# Patient Record
Sex: Male | Born: 1970 | Race: White | Hispanic: No | Marital: Married | Smoking: Never smoker
Health system: Southern US, Community
[De-identification: ages and names within clinical notes are randomized; demographics above are authoritative.]

## PROBLEM LIST (undated history)

## (undated) DIAGNOSIS — I10 Essential (primary) hypertension: Secondary | ICD-10-CM

## (undated) DIAGNOSIS — M109 Gout, unspecified: Secondary | ICD-10-CM

## (undated) HISTORY — PX: APPENDECTOMY: SHX54

---

## 2016-11-20 ENCOUNTER — Encounter: Payer: Self-pay | Admitting: *Deleted

## 2016-11-20 ENCOUNTER — Emergency Department
Admission: EM | Admit: 2016-11-20 | Discharge: 2016-11-20 | Disposition: A | Discharge status: Home / Self Care | Payer: 59 | Source: Home / Self Care | Attending: Family Medicine | Admitting: Family Medicine

## 2016-11-20 DIAGNOSIS — R42 Dizziness and giddiness: Secondary | ICD-10-CM

## 2016-11-20 DIAGNOSIS — H6593 Unspecified nonsuppurative otitis media, bilateral: Secondary | ICD-10-CM

## 2016-11-20 HISTORY — DX: Essential (primary) hypertension: I10

## 2016-11-20 LAB — POCT FASTING CBG KUC MANUAL ENTRY: POCT Glucose (KUC): 163 mg/dL — AB (ref 70–99)

## 2016-11-20 MED ORDER — LORATADINE 10 MG PO TABS: 10.0000 mg | ORAL_TABLET | Freq: Every day | ORAL | 1 refills | Status: AC

## 2016-11-20 MED ORDER — PREDNISONE 20 MG PO TABS: ORAL_TABLET | ORAL | 0 refills | Status: AC

## 2016-11-20 MED ORDER — IPRATROPIUM BROMIDE 0.06 % NA SOLN: 2.0000 | Freq: Four times a day (QID) | NASAL | 1 refills | Status: AC

## 2016-11-20 NOTE — ED Provider Notes (Signed)
[facility]    CSN: [id number] Arrival date & time: 11/20/16  1032   History   Chief Complaint Chief Complaint  Patient presents with  . Dizziness    HPI Blake Mckinney is a 46 y.o. male.   HPI  Blake Mckinney is a 46 y.o. male presenting to UC with c/o mild frontal sinus pressure this morning, denies pain/headache.  [place] [facility] [place] felt a mild dizziness/lightheadedness and warmth that "washed over me."  He ate a biscuit this morning then ate a cookie after the spell but only felt moderately better.  He still has intermittent sensation of dizziness with certain movements.  He notes he did take BC powder this morning for back pain, which he does not usually take in the morning. Otherwise, no recent changes.  He does have mild congestion and reports environmental allergies. He has been off his Claritin for several days. He has been on Flonase and prednisone in the past for environmental rhinitis.  Denies fever, chills, n/v/d. Denies weakness or numbness in arms or legs.    Past Medical History:  Diagnosis Date  . Hypertension     There are no active problems to display for this patient.   Past Surgical History:  Procedure Laterality Date  . APPENDECTOMY         Home Medications    Prior to Admission medications   Medication Sig Start Date End Date Taking? Authorizing Provider  ALLOPURINOL PO Take by mouth.   Yes [provider]  AMLODIPINE BESYLATE PO Take by mouth.   Yes [provider]  hydrochlorothiazide (HYDRODIURIL) 25 MG tablet Take 25 mg by mouth daily.   Yes [provider]  ipratropium (ATROVENT) 0.06 % nasal spray Place 2 sprays into both nostrils 4 (four) times daily. 11/20/16   Lurene ShadowPhelps, Marcio Hoque O, PA-C  loratadine (CLARITIN) 10 MG tablet Take 1 tablet (10 mg total) by mouth daily. One po daily x 5 days 11/20/16   Lurene ShadowPhelps, Eiliyah Reh O, PA-C  predniSONE (DELTASONE) 20 MG tablet 3 tabs po day one, then 2 po daily x  4 days 11/20/16   Lurene ShadowPhelps, Vianny Schraeder O, PA-C    Family History Family History  Problem Relation Age of Onset  . Cancer Mother        Breast CA  . COPD Father     Social History Social History  Substance Use Topics  . Smoking status: Never Smoker  . Smokeless tobacco: Never Used  . Alcohol use No     Allergies   Erythromycin   Review of Systems Review of Systems  Constitutional: Negative for chills and fever.  HENT: Positive for congestion ( mild) and sinus pressure. Negative for ear pain, rhinorrhea, sore throat, trouble swallowing and voice change.   Respiratory: Negative for cough and shortness of breath.   Cardiovascular: Negative for chest pain and palpitations.  Gastrointestinal: Negative for abdominal pain, diarrhea, nausea and vomiting.  Musculoskeletal: Negative for arthralgias, back pain and myalgias.  Skin: Negative for rash.  Neurological: Positive for dizziness and light-headedness. Negative for syncope, facial asymmetry, speech difficulty, weakness, numbness and headaches.     Physical Exam Triage Vital Signs ED Triage Vitals [11/20/16 1100]  Enc Vitals Group     BP (!) 147/91     Pulse Rate 91     Resp      Temp 98.2 F (36.8 C)     Temp Source Oral     SpO2 100 %  Weight 234 lb (106.1 kg)     Height  (1.88 m)     Head Circumference      Peak Flow      Pain Score 0     Pain Loc      Pain Edu?      Excl. in GC?    Orthostatic VS for the past 24 hrs:  BP- Lying Pulse- Lying BP- Sitting Pulse- Sitting BP- Standing at 0 minutes Pulse- Standing at 0 minutes  11/20/16 1105 (!) 147/91 86 (!) 141/94 98 128/87 95    Updated Vital Signs BP (!) 147/91 (BP Location: Left Arm)   Pulse 91   Temp 98.2 F (36.8 C) (Oral)   Ht  (1.88 m)   Wt 234 lb (106.1 kg)   SpO2 100%   BMI 30.04 kg/m   Visual Acuity Right Eye Distance:   Left Eye Distance:   Bilateral Distance:    Right Eye Near:   Left Eye Near:    Bilateral Near:     Physical  Exam  Constitutional: He is oriented to person, place, and time. He appears well-developed and well-nourished. No distress.  HENT:  Head: Normocephalic and atraumatic.  Right Ear: A middle ear effusion is present.  Left Ear: Tympanic membrane normal.  Nose: Nose normal. Right sinus exhibits no maxillary sinus tenderness and no frontal sinus tenderness. Left sinus exhibits no maxillary sinus tenderness and no frontal sinus tenderness.  Mouth/Throat: Uvula is midline, oropharynx is clear and moist and mucous membranes are normal.  Eyes: Pupils are equal, round, and reactive to light. Conjunctivae and EOM are normal. Right eye exhibits no nystagmus. Left eye exhibits no nystagmus.  Neck: Normal range of motion. Neck supple.  Cardiovascular: Normal rate and regular rhythm.   Pulmonary/Chest: Effort normal and breath sounds normal. No stridor. No respiratory distress. He has no wheezes. He has no rales.  Musculoskeletal: Normal range of motion.  Lymphadenopathy:    He has no cervical adenopathy.  Neurological: He is alert and oriented to person, place, and time. No cranial nerve deficit.  CN II-XII in tact. Speech is clear. Alert to person, place and time. Normal finger to nose coordination. Normal gait.  Skin: Skin is warm and dry. He is not diaphoretic.  Psychiatric: He has a normal mood and affect. His behavior is normal.  Nursing note and vitals reviewed.    UC Treatments / Results  Labs (all labs ordered are listed, but only abnormal results are displayed) Labs Reviewed  POCT FASTING CBG [facility] MANUAL ENTRY - Abnormal; Notable for the following:       Result Value   POCT Glucose ([facility]) 163 (*)    All other components within normal limits    EKG  EKG Interpretation None       Radiology No results found.  Procedures Procedures (including critical care time)  Medications Ordered in UC Medications - No data to display   Initial Impression / Assessment and Plan / UC Course    I have reviewed the triage vital signs and the nursing notes.  Pertinent labs & imaging results that were available during my care of the patient were reviewed by me and considered in my medical decision making (see chart for details).     Pt c/o intermittent lightheadedness that started this morning with mild sinus pressure. Normal neuro exam.   Tympanometry: Right ear- questionable High Peak Height, Left ear- Positive tympanometric peak pressure.  Will treat with prednisone, Claritin,  and ipratropium nasal spray.  Final Clinical Impressions(s) / UC Diagnoses   Final diagnoses:  Lightheadedness  Middle ear effusion, bilateral   Doubt CVA, SAH, meningitis, or other emergent process taking place at this time.  Encouraged rest and good hydration F/u with PCP early next week if not improving. Discussed symptoms that warrant emergent care in the ED.   New Prescriptions Discharge Medication List as of 11/20/2016 11:26 AM    START taking these medications   Details  ipratropium (ATROVENT) 0.06 % nasal spray Place 2 sprays into both nostrils 4 (four) times daily., Starting Fri 11/20/2016, Normal    loratadine (CLARITIN) 10 MG tablet Take 1 tablet (10 mg total) by mouth daily. One po daily x 5 days, Starting Fri 11/20/2016, Normal    predniSONE (DELTASONE) 20 MG tablet 3 tabs po day one, then 2 po daily x 4 days, Normal         Controlled Substance Prescriptions [place] Controlled Substance Registry consulted? Not Applicable   Rolla Plate 11/20/16 1202

## 2016-11-20 NOTE — ED Triage Notes (Signed)
Pt reports a mild frontal HA this AM that has resolved. Later this AM c/o sudden feeling of dizziness that "washed over me". He ate a biscuit this AM and a cookie after the spell he reports he took a BC powder this AM and does not usually take them.

## 2016-11-22 ENCOUNTER — Telehealth: Payer: Self-pay | Admitting: Emergency Medicine

## 2016-11-22 NOTE — Telephone Encounter (Signed)
CB to patient, he states he is not quite as dizzy. [facility].  AP, CMA

## 2017-02-23 ENCOUNTER — Emergency Department
Admission: EM | Admit: 2017-02-23 | Discharge: 2017-02-23 | Disposition: A | Discharge status: Home / Self Care | Payer: 59 | Attending: Emergency Medicine | Admitting: Emergency Medicine

## 2017-02-23 ENCOUNTER — Encounter: Payer: Self-pay | Admitting: Emergency Medicine

## 2017-02-23 ENCOUNTER — Emergency Department: Payer: 59

## 2017-02-23 ENCOUNTER — Other Ambulatory Visit: Payer: Self-pay

## 2017-02-23 DIAGNOSIS — K219 Gastro-esophageal reflux disease without esophagitis: Secondary | ICD-10-CM | POA: Diagnosis not present

## 2017-02-23 DIAGNOSIS — R0789 Other chest pain: Secondary | ICD-10-CM | POA: Diagnosis not present

## 2017-02-23 DIAGNOSIS — E876 Hypokalemia: Secondary | ICD-10-CM | POA: Insufficient documentation

## 2017-02-23 DIAGNOSIS — R079 Chest pain, unspecified: Secondary | ICD-10-CM | POA: Diagnosis present

## 2017-02-23 DIAGNOSIS — I10 Essential (primary) hypertension: Secondary | ICD-10-CM | POA: Diagnosis not present

## 2017-02-23 DIAGNOSIS — F17228 Nicotine dependence, chewing tobacco, with other nicotine-induced disorders: Secondary | ICD-10-CM | POA: Diagnosis not present

## 2017-02-23 DIAGNOSIS — Z79899 Other long term (current) drug therapy: Secondary | ICD-10-CM | POA: Insufficient documentation

## 2017-02-23 HISTORY — DX: Gout, unspecified: M10.9

## 2017-02-23 LAB — BASIC METABOLIC PANEL
ANION GAP: 13 (ref 5–15)
BUN: 12 mg/dL (ref 6–20)
CALCIUM: 9.6 mg/dL (ref 8.9–10.3)
CO2: 29 mmol/L (ref 22–32)
Chloride: 101 mmol/L (ref 101–111)
Creatinine, Ser: 0.99 mg/dL (ref 0.61–1.24)
GFR calc Af Amer: >60 mL/min (ref >60)
GLUCOSE: 115 mg/dL — AB (ref 65–99)
Potassium: 3.1 mmol/L — ABNORMAL LOW (ref 3.5–5.1)
Sodium: 143 mmol/L (ref 135–145)

## 2017-02-23 LAB — CBC
HEMATOCRIT: 49.1 % (ref 40.0–52.0)
Hemoglobin: 17.2 g/dL (ref 13.0–18.0)
MCH: 32.1 pg (ref 26.0–34.0)
MCHC: 35.1 g/dL (ref 32.0–36.0)
MCV: 91.5 fL (ref 80.0–100.0)
PLATELETS: 255 10*3/uL (ref 150–440)
RBC: 5.37 MIL/uL (ref 4.40–5.90)
RDW: 13.3 % (ref 11.5–14.5)
WBC: 8.1 10*3/uL (ref 3.8–10.6)

## 2017-02-23 LAB — TROPONIN I

## 2017-02-23 MED ORDER — ASPIRIN 81 MG PO CHEW
324.0000 mg | CHEWABLE_TABLET | Freq: Once | ORAL | Status: AC
  Administered 2017-02-23: 324 mg via ORAL
  Filled 2017-02-23: qty 4

## 2017-02-23 MED ORDER — POTASSIUM CHLORIDE CRYS ER 20 MEQ PO TBCR
40.0000 meq | EXTENDED_RELEASE_TABLET | Freq: Once | ORAL | Status: AC
  Administered 2017-02-23: 40 meq via ORAL
  Filled 2017-02-23: qty 2

## 2017-02-23 MED ORDER — POTASSIUM CHLORIDE CRYS ER 20 MEQ PO TBCR: 20.0000 meq | EXTENDED_RELEASE_TABLET | Freq: Every day | ORAL | 0 refills | Status: AC

## 2017-02-23 MED ORDER — OMEPRAZOLE MAGNESIUM 20 MG PO TBEC: 20.0000 mg | DELAYED_RELEASE_TABLET | Freq: Every day | ORAL | 1 refills | Status: AC

## 2017-02-23 MED ORDER — GI COCKTAIL ~~LOC~~
30.0000 mL | Freq: Once | ORAL | Status: AC
  Administered 2017-02-23: 30 mL via ORAL
  Filled 2017-02-23: qty 30

## 2017-02-23 NOTE — ED Notes (Signed)
Patient presents to the ED with intermittent chest pain since Saturday.  Patient also reports a lack of appetite and states, "I feel like I can't burp."  Patient denies shortness of breath.  Patient is in no obvious distress at this time.

## 2017-02-23 NOTE — ED Triage Notes (Signed)
Pt to ed with c/o pain on Saturday, Sunday and Monday intermittently.  Pt states "it also feels like I have heartburn"  Denies sob, weakness.

## 2017-02-23 NOTE — Discharge Instructions (Addendum)
You have been seen in the Emergency Department (ED) today for chest pain.  As we have discussed today?s test results are normal, but you may require further testing.  Please follow up with the recommended doctor as instructed above in these documents regarding today?s emergent visit and your recent symptoms to discuss further management.  Continue to take your regular medications. If you are not doing so already, please also take a daily baby aspirin (81 mg), at least until you follow up with your doctor.  We also encourage you to take a daily antacid medication such as Nexium OTC or Prilosec OTC (available without a prescription, and available as generics).  Please take it according to the instructions on the package, and give it at least a week or two to see if it will work for you and help with your symptoms.  Return to the Emergency Department (ED) if you experience any further chest pain/pressure/tightness, difficulty breathing, or sudden sweating, or other symptoms that concern you.

## 2017-02-23 NOTE — ED Provider Notes (Signed)
[facility] Emergency Department Provider Note  ____________________________________________   First MD Initiated Contact with Patient 02/23/17 1055     (approximate)  I have reviewed the triage vital signs and the nursing notes.   HISTORY  Chief Complaint Chest Pain    HPI Blake Mckinney is a 46 y.o. male with medical history as listed below who presents for evaluation of intermittent chest discomfort for the last several days.  He reports that the symptoms are worse at night.  It seems to mostly be behind his sternum and feels like a burning sensation.  He also noticed that his acid reflux symptoms have been worse over the last several days, which he associates with the feeling like he cannot burp and some bloating as well as discomfort when he lies flat.  He does not take any regular medicines such as a proton pump inhibitor, but he does occasionally use drink some water with baking soda when he feels the reflux.  He denies shortness of breath, nausea, vomiting, abdominal pain, fever/chills, and dysuria.  Nothing in particular makes his symptoms worse at this better or worse at this time.   Past Medical History:  Diagnosis Date  . Gout   . Hypertension     There are no active problems to display for this patient.   Past Surgical History:  Procedure Laterality Date  . APPENDECTOMY      Prior to Admission medications   Medication Sig Start Date End Date Taking? Authorizing Provider  allopurinol (ZYLOPRIM) 300 MG tablet Take 300 mg by mouth daily. 12/09/16   [provider]  amLODipine (NORVASC) 10 MG tablet Take 10 mg by mouth daily. 02/16/17   [provider]  hydrochlorothiazide (HYDRODIURIL) 25 MG tablet Take 25 mg by mouth daily.    [provider]  ipratropium (ATROVENT) 0.06 % nasal spray Place 2 sprays into both nostrils 4 (four) times daily. 11/20/16   Lurene ShadowPhelps, Erin O, PA-C  loratadine (CLARITIN) 10 MG tablet Take 1  tablet (10 mg total) by mouth daily. One po daily x 5 days 11/20/16   Lurene ShadowPhelps, Erin O, PA-C  omeprazole (PRILOSEC OTC) 20 MG tablet Take 1 tablet (20 mg total) by mouth daily. 02/23/17 02/23/18  Loleta RoseForbach, Kellyn Mccary, MD  potassium chloride SA (KLOR-CON M20) 20 MEQ tablet Take 1 tablet (20 mEq total) by mouth daily. 02/23/17   Loleta RoseForbach, Phyllis Abelson, MD  predniSONE (DELTASONE) 20 MG tablet 3 tabs po day one, then 2 po daily x 4 days 11/20/16   Lurene ShadowPhelps, Erin O, PA-C    Allergies Erythromycin  Family History  Problem Relation Age of Onset  . Cancer Mother        Breast CA  . COPD Father     Social History Social History   Tobacco Use  . Smoking status: Never Smoker  . Smokeless tobacco: Current User  Substance Use Topics  . Alcohol use: No  . Drug use: No    Review of Systems Constitutional: No fever/chills Eyes: No visual changes. ENT: No sore throat. Cardiovascular: Burning chest discomfort intermittently for the last several days Respiratory: Denies shortness of breath. Gastrointestinal: No abdominal pain.  No nausea, no vomiting.  No diarrhea.  No constipation.  Feels like he "cannot burp" and feels somewhat bloated Genitourinary: Negative for dysuria. Musculoskeletal: Negative for neck pain.  Negative for back pain. Integumentary: Negative for rash. Neurological: Negative for headaches, focal weakness or numbness.   ____________________________________________   PHYSICAL EXAM:  VITAL SIGNS: ED  Triage Vitals  Enc Vitals Group     BP 02/23/17 0930 (!) 146/88     Pulse Rate 02/23/17 0930 83     Resp 02/23/17 0930 18     Temp 02/23/17 0930 98 F (36.7 C)     Temp Source 02/23/17 0930 Oral     SpO2 02/23/17 0930 99 %     Weight --      Height --      Head Circumference --      Peak Flow --      Pain Score 02/23/17 0932 2     Pain Loc --      Pain Edu? --      Excl. in GC? --     Constitutional: Alert and oriented. Well appearing and in no acute distress. Eyes: Conjunctivae  are normal.  Head: Atraumatic. Nose: No congestion/rhinnorhea. Mouth/Throat: Mucous membranes are moist. Neck: No stridor.  No meningeal signs.   Cardiovascular: Normal rate, regular rhythm. Good peripheral circulation. Grossly normal heart sounds. Respiratory: Normal respiratory effort.  No retractions. Lungs CTAB. Gastrointestinal: Soft and nontender. No distention.  Musculoskeletal: No lower extremity tenderness nor edema. No gross deformities of extremities. Neurologic:  Normal speech and language. No gross focal neurologic deficits are appreciated.  Skin:  Skin is warm, dry and intact. No rash noted. Psychiatric: Mood and affect are normal. Speech and behavior are normal.  ____________________________________________   LABS (all labs ordered are listed, but only abnormal results are displayed)  Labs Reviewed  BASIC METABOLIC PANEL - Abnormal; Notable for the following components:      Result Value   Potassium 3.1 (*)    Glucose, Bld 115 (*)    All other components within normal limits  CBC  TROPONIN I   ____________________________________________  EKG  ED ECG REPORT #1 I, Loleta Rose, the attending physician, personally viewed and interpreted this ECG.  Date: 02/23/2017 EKG Time: 9:27 AM Rate: 86 Rhythm: normal sinus rhythm QRS Axis: normal Intervals: normal ST/T Wave abnormalities: Notched and prominent T wave in lead V3 with some artifact present in V1 and V2 Narrative Interpretation: Non-specific ST segment / T-wave changes, but no evidence of acute ischemia.   ED ECG REPORT #2 I, Loleta Rose, the attending physician, personally viewed and interpreted this ECG.  Date: 02/23/2017 EKG Time: 11:25 Rate: 70 Rhythm: normal sinus rhythm QRS Axis: normal Intervals: normal ST/T Wave abnormalities: normal Narrative Interpretation: no evidence of acute ischemia   ____________________________________________  RADIOLOGY   Dg Chest 2 View  Result Date:  02/23/2017 CLINICAL DATA:  46 year old with intermittent center and left-sided chest pain. EXAM: CHEST  2 VIEW COMPARISON:  None. FINDINGS: Lungs are clear. Density along the anterior lower chest probably related to overlying shadows from the surrounding heart structures. No pulmonary edema or focal airspace disease. Trachea is midline. Heart and mediastinum are within normal limits. No pleural effusions. Bone structures are unremarkable. IMPRESSION: No active cardiopulmonary disease. Electronically Signed   By: Richarda Overlie M.D.   On: 02/23/2017 10:15    ____________________________________________   PROCEDURES  Critical Care performed: No   Procedure(s) performed:   Procedures   ____________________________________________   INITIAL IMPRESSION / ASSESSMENT AND PLAN / ED COURSE  As part of my medical decision making, I reviewed the following data within the electronic MEDICAL RECORD NUMBER Nursing notes reviewed and incorporated, Labs reviewed , EKG interpreted , Old chart reviewed and Notes from prior ED visits    Differential diagnosis includes, but is  not limited to, ACS, aortic dissection, pulmonary embolism, cardiac tamponade, pneumothorax, pneumonia, pericarditis, myocarditis, GI-related causes including esophagitis/gastritis, and musculoskeletal chest wall pain.  The patient signs and symptoms today sound most consistent with GI related chest discomfort. Low risk HEART score of 2, PERC negative.  The initial EKG showed an abnormality in V3 and some artifact in V1 and V2, but on repeat it looks better with no change in his symptoms which is reassuring and to me does not represent dynamic EKG changes concerning for ischemia, but rather artifact or lead placement abnormality with the first.  I discussed with the patient his potassium level and the fact that otherwise his labs are reassuring.  His chest x-ray shows no acute abnormalities.  We discussed the probability that his results are GI  related but I also encouraged him to follow-up with a cardiologist if he desires to do so for additional testing including possibly a stress test.  I gave him a full dose aspirin and a GI cocktail as well as a potassium supplement here in the emergency department, and I wrote him a prescription for over-the-counter Prilosec and potassium supplement for the next week.  I gave my usual customary return precautions and he understands and agrees with the plan.     ____________________________________________  FINAL CLINICAL IMPRESSION(S) / ED DIAGNOSES  Final diagnoses:  Atypical chest pain  Gastroesophageal reflux disease, esophagitis presence not specified  Hypokalemia     MEDICATIONS GIVEN DURING THIS VISIT:  Medications  gi cocktail (Maalox,Lidocaine,Donnatal) (not administered)  potassium chloride SA (K-DUR,KLOR-CON) CR tablet 40 mEq (not administered)  aspirin chewable tablet 324 mg (324 mg Oral Given 02/23/17 1128)     ED Discharge Orders        Ordered    potassium chloride SA (KLOR-CON M20) 20 MEQ tablet  Daily     02/23/17 1123    omeprazole (PRILOSEC OTC) 20 MG tablet  Daily     02/23/17 1123       Note:  This document was prepared using Dragon voice recognition software and may include unintentional dictation errors.    Loleta RoseForbach, Axil Copeman, MD 02/23/17 [phone]

## 2019-05-22 IMAGING — CR DG CHEST 2V
2 series · 2 of 2 positions shown · non-contrast
Comparison: None.

CLINICAL DATA: 46-year-old with intermittent center and left-sided
chest pain.

EXAM:
CHEST  2 VIEW

[chest pa]
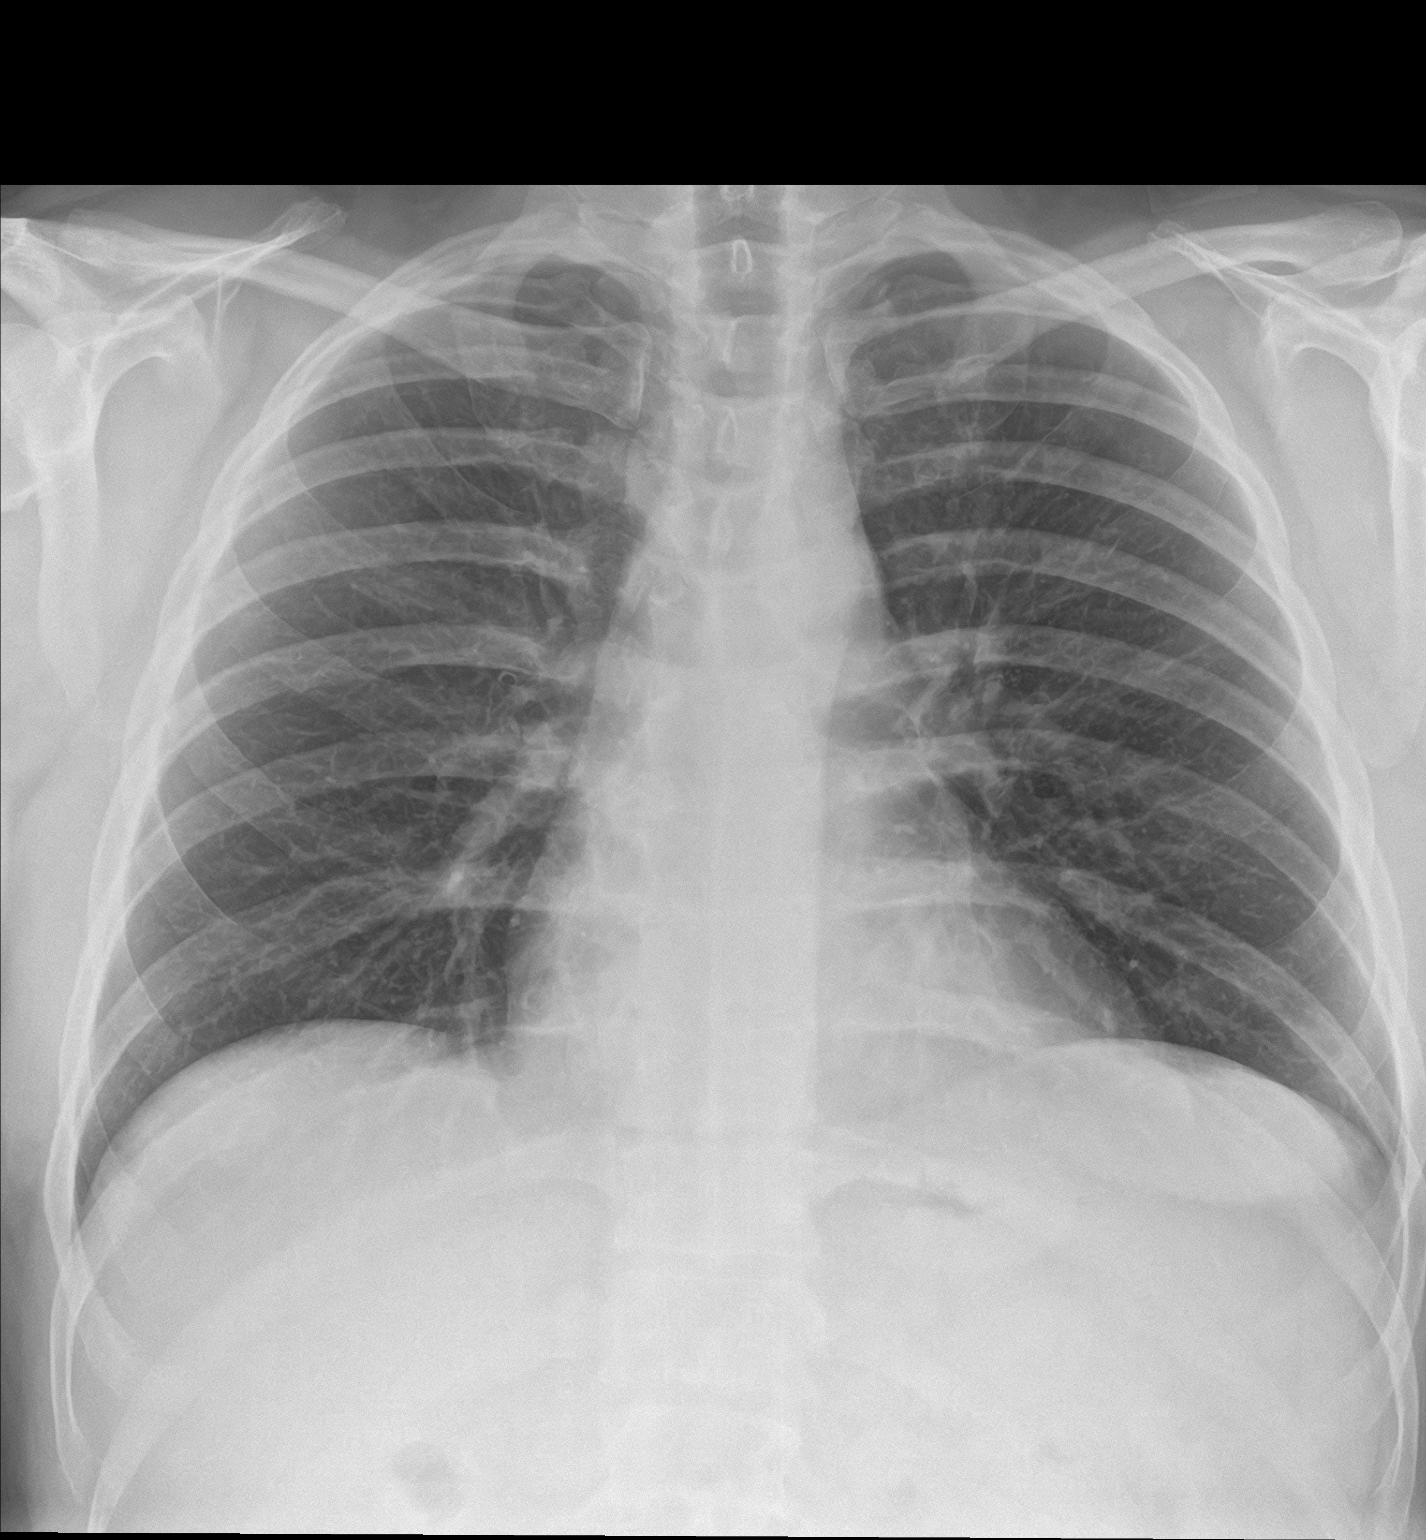

[chest lat]
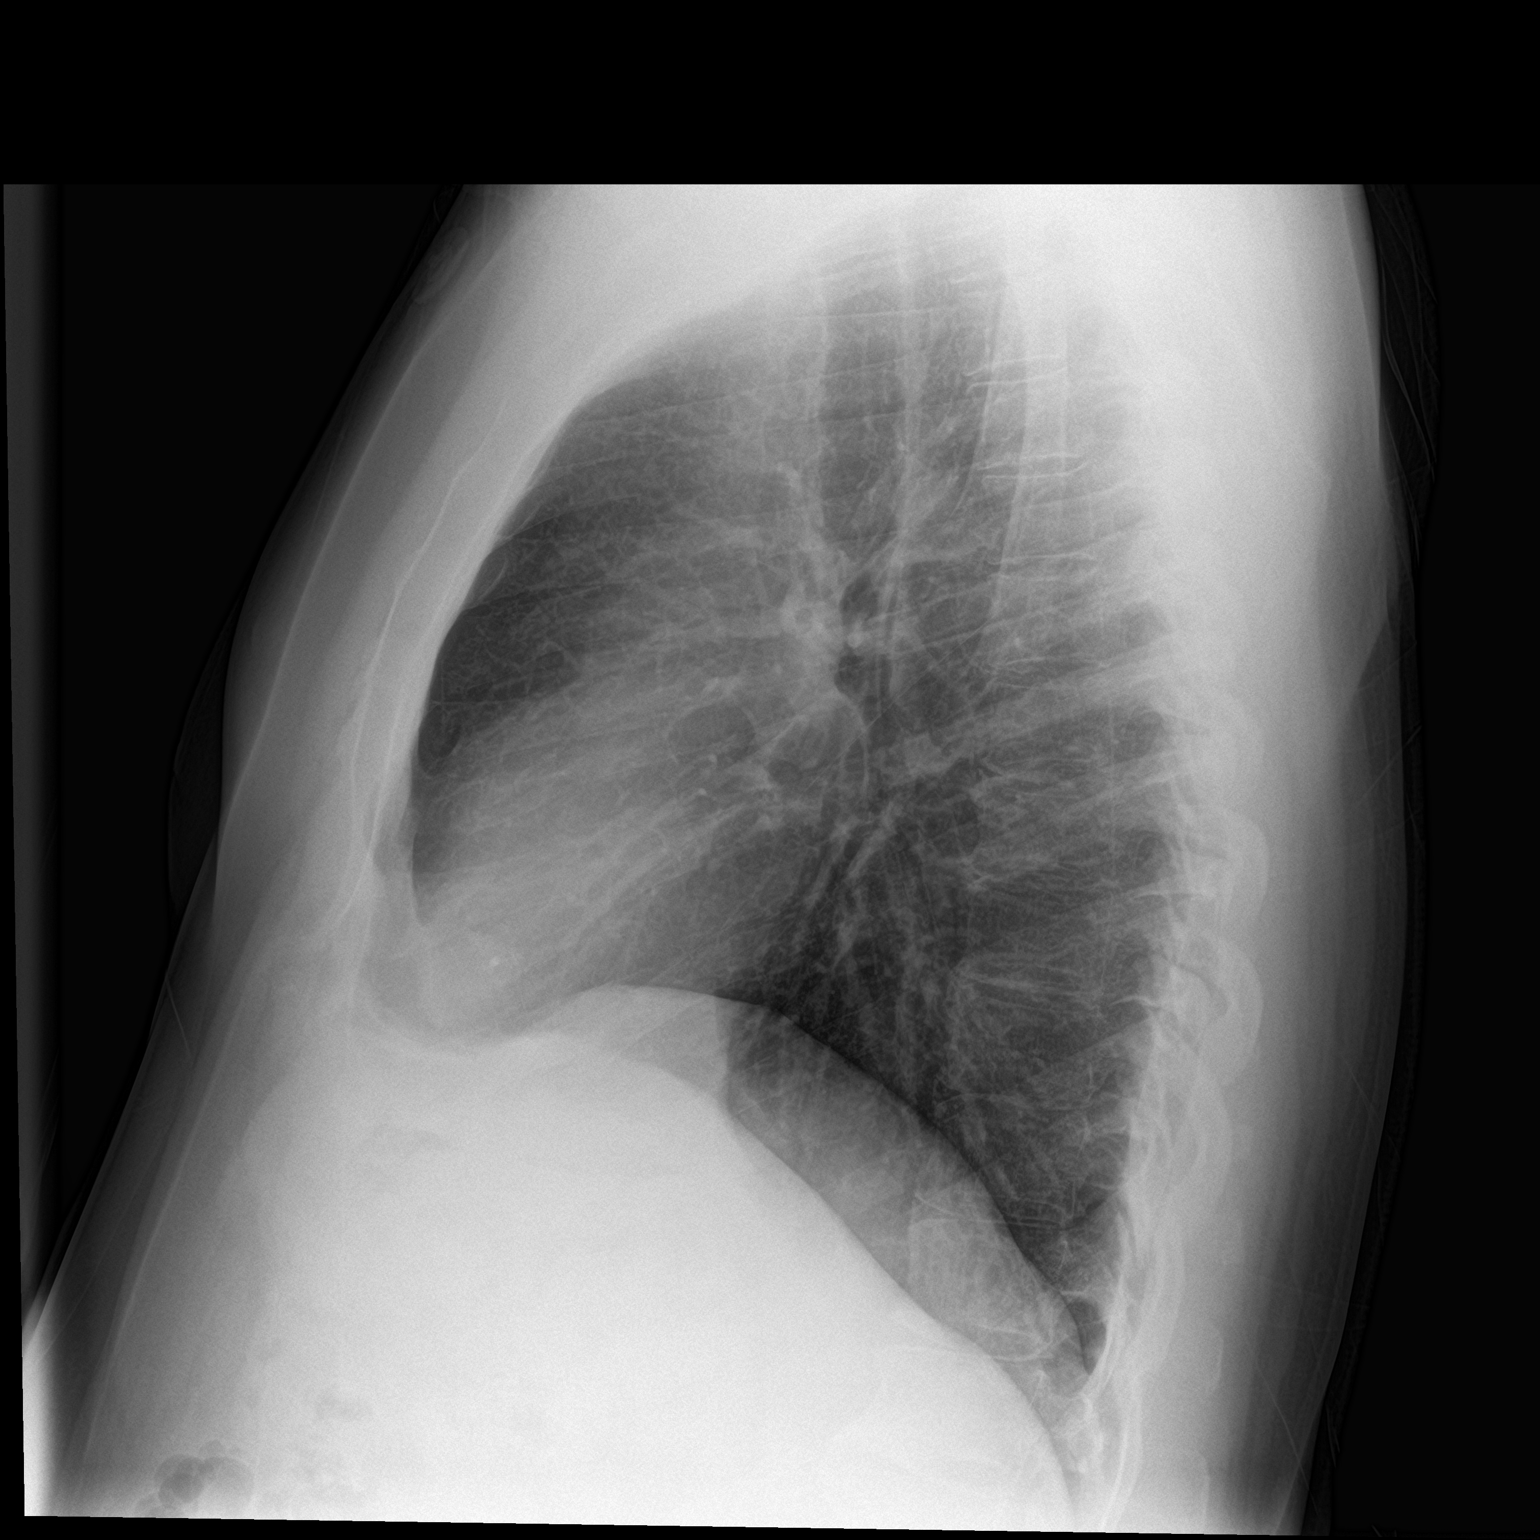

[2 of 2 positions shown; findings below may reference images not displayed]

FINDINGS: Lungs are clear. Density along the anterior lower chest probably
related to overlying shadows from the surrounding heart structures.
No pulmonary edema or focal airspace disease. Trachea is midline.
Heart and mediastinum are within normal limits. No pleural
effusions. Bone structures are unremarkable.
IMPRESSION: No active cardiopulmonary disease.

## 2019-06-16 ENCOUNTER — Ambulatory Visit: Payer: 59 | Attending: Internal Medicine

## 2019-06-16 DIAGNOSIS — Z23 Encounter for immunization: Secondary | ICD-10-CM

## 2019-06-16 NOTE — Progress Notes (Signed)
   Covid-19 Vaccination Clinic  Name:  Blake Mckinney    MRN: [id number] DOB: 06-09-1970  06/16/2019  [name] was observed post Covid-19 immunization for 15 minutes without incident. He was provided with Vaccine Information Sheet and instruction to access the V-Safe system.   [name] was instructed to call 911 with any severe reactions post vaccine: Marland Kitchen Difficulty breathing  . Swelling of face and throat  . A fast heartbeat  . A bad rash all over body  . Dizziness and weakness   Immunizations Administered    Name Date Dose VIS Date Route   Pfizer COVID-19 Vaccine 06/16/2019 11:40 AM 0.3 mL 02/24/2019 Intramuscular   Manufacturer: [employer], [employer]   Lot: [id number]   NDC: [address]-9

## 2019-07-10 ENCOUNTER — Ambulatory Visit: Payer: 59 | Attending: Internal Medicine

## 2019-07-10 DIAGNOSIS — Z23 Encounter for immunization: Secondary | ICD-10-CM

## 2019-07-10 NOTE — Progress Notes (Signed)
   Covid-19 Vaccination Clinic  Name:  Derward Marple    MRN: [id number] DOB: 06-02-1970  07/10/2019  Mr. Sinning was observed post Covid-19 immunization for 15 minutes without incident. He was provided with Vaccine Information Sheet and instruction to access the V-Safe system.   Mr. Niblack was instructed to call 911 with any severe reactions post vaccine: Marland Kitchen Difficulty breathing  . Swelling of face and throat  . A fast heartbeat  . A bad rash all over body  . Dizziness and weakness   Immunizations Administered    Name Date Dose VIS Date Route   Pfizer COVID-19 Vaccine 07/10/2019 12:38 PM 0.3 mL 05/10/2018 Intramuscular   Manufacturer: [employer], [employer]   Lot: [id number]   NDC: [address]-8
